# Patient Record
Sex: Male | Born: 1950 | Race: White | Hispanic: No | Marital: Married | Smoking: Former smoker
Health system: Southern US, Community
[De-identification: ages and names within clinical notes are randomized; demographics above are authoritative.]

## PROBLEM LIST (undated history)

## (undated) DIAGNOSIS — E559 Vitamin D deficiency, unspecified: Secondary | ICD-10-CM

## (undated) DIAGNOSIS — I10 Essential (primary) hypertension: Secondary | ICD-10-CM

## (undated) DIAGNOSIS — E785 Hyperlipidemia, unspecified: Secondary | ICD-10-CM

## (undated) DIAGNOSIS — R7303 Prediabetes: Secondary | ICD-10-CM

## (undated) HISTORY — DX: Essential (primary) hypertension: I10

## (undated) HISTORY — DX: Prediabetes: R73.03

## (undated) HISTORY — DX: Vitamin D deficiency, unspecified: E55.9

## (undated) HISTORY — DX: Hyperlipidemia, unspecified: E78.5

---

## 1986-11-02 HISTORY — PX: KNEE ARTHROSCOPY: SHX127

## 1998-02-12 ENCOUNTER — Ambulatory Visit (HOSPITAL_COMMUNITY): Admission: RE | Admit: 1998-02-12 | Discharge: 1998-02-12 | Payer: Self-pay | Admitting: Internal Medicine

## 1998-07-22 ENCOUNTER — Ambulatory Visit (HOSPITAL_COMMUNITY): Admission: RE | Admit: 1998-07-22 | Discharge: 1998-07-22 | Payer: Self-pay | Admitting: Gastroenterology

## 2001-03-23 ENCOUNTER — Ambulatory Visit (HOSPITAL_COMMUNITY): Admission: RE | Admit: 2001-03-23 | Discharge: 2001-03-23 | Payer: Self-pay | Admitting: Internal Medicine

## 2001-03-23 ENCOUNTER — Encounter: Payer: Self-pay | Admitting: Internal Medicine

## 2002-05-19 ENCOUNTER — Emergency Department (HOSPITAL_COMMUNITY): Admission: EM | Admit: 2002-05-19 | Discharge: 2002-05-20 | Payer: Self-pay | Admitting: Emergency Medicine

## 2003-07-02 ENCOUNTER — Encounter: Payer: Self-pay | Admitting: Internal Medicine

## 2003-07-02 ENCOUNTER — Ambulatory Visit (HOSPITAL_COMMUNITY): Admission: RE | Admit: 2003-07-02 | Discharge: 2003-07-02 | Payer: Self-pay | Admitting: Internal Medicine

## 2003-07-16 ENCOUNTER — Ambulatory Visit (HOSPITAL_COMMUNITY): Admission: RE | Admit: 2003-07-16 | Discharge: 2003-07-16 | Payer: Self-pay | Admitting: Internal Medicine

## 2003-07-16 ENCOUNTER — Encounter: Payer: Self-pay | Admitting: Internal Medicine

## 2003-07-25 ENCOUNTER — Encounter: Payer: Self-pay | Admitting: Internal Medicine

## 2003-07-25 ENCOUNTER — Ambulatory Visit (HOSPITAL_COMMUNITY): Admission: RE | Admit: 2003-07-25 | Discharge: 2003-07-25 | Payer: Self-pay | Admitting: Internal Medicine

## 2007-06-28 ENCOUNTER — Ambulatory Visit (HOSPITAL_COMMUNITY): Admission: RE | Admit: 2007-06-28 | Discharge: 2007-06-28 | Payer: Self-pay | Admitting: Internal Medicine

## 2007-11-03 HISTORY — PX: HEMORRHOID SURGERY: SHX153

## 2008-04-16 ENCOUNTER — Encounter (INDEPENDENT_AMBULATORY_CARE_PROVIDER_SITE_OTHER): Payer: Self-pay | Admitting: *Deleted

## 2008-04-16 ENCOUNTER — Ambulatory Visit (HOSPITAL_COMMUNITY): Admission: RE | Admit: 2008-04-16 | Discharge: 2008-04-16 | Payer: Self-pay | Admitting: *Deleted

## 2011-03-17 NOTE — Op Note (Signed)
NAME:  Scott Parrish, Scott Parrish                ACCOUNT NO.:  [id number]   MEDICAL RECORD NO.:  [id number]          PATIENT TYPE:  AMB   LOCATION:  DAY                          FACILITY:  [facility]   PHYSICIAN:  Alfonse Ras, MD   DATE OF BIRTH:  1951/09/04   DATE OF PROCEDURE:  04/16/2008  DATE OF DISCHARGE:                               OPERATIVE REPORT   PREOPERATIVE DIAGNOSIS:  Grade II and III internal hemorrhoids.   POSTOPERATIVE DIAGNOSIS:  Grade II and III internal hemorrhoids.   PROCEDURE:  Stapled hemorrhoidectomy, BPH.   SURGEON:  Alfonse Ras, MD.   ANESTHESIA:  General.   DESCRIPTION:  The patient was taken to the operating room and placed in  supine position.  After general anesthesia was induced using  endotracheal tube, the patient was placed in the  prone-jackknife  position.  Perianal and rectal prep were undertaken in the standard  fashion.  Anal dilatation was accomplished gently to three  fingerbreadths.  Internal hemorrhoidal bundles were injected in all  three quadrants with 0.25 Marcaine with Wydase. This was massaged in  place.  Additional 40 mL of 0.25 Marcaine were injected into the  internal sphincter muscle circumferentially.  I then placed a 2-0  Prolene suture in a pursestring fashion, approximately 5 cm proximal to  the dentate line in the mucosal and submucosal fashion.  This was done  circumferentially.  PPH stapler was then inserted proximal to the  pursestring suture and the pursestring was tied down around the anvil.  The sutures were pulled through the stapling device and the stapler was  closed and held in place for 1 minute.  It was then fired and held in  place for an additional 30 seconds.  It was removed without any  difficulty.  I had a good 2 cm rim of hemorrhoidal tissue that was sent  for pathologic evaluation.  Staple line was inspected.  Adequate  hemostasis was ensured.  Gelfoam packing was placed.  The patient  tolerated the procedure  well and went to  PACU in good condition.      Alfonse Ras, MD  Electronically Signed     KRE/MEDQ  D:  04/16/2008  T:  04/16/2008  Job:  [id number]

## 2011-07-30 LAB — BASIC METABOLIC PANEL
BUN: 14
CO2: 24
Chloride: 108
Glucose, Bld: 102 — ABNORMAL HIGH
Potassium: 3.8

## 2011-07-30 LAB — HEMOGLOBIN AND HEMATOCRIT, BLOOD: Hemoglobin: 14.1

## 2013-09-28 ENCOUNTER — Other Ambulatory Visit: Payer: Self-pay | Admitting: Internal Medicine

## 2014-01-19 DIAGNOSIS — R7303 Prediabetes: Secondary | ICD-10-CM | POA: Insufficient documentation

## 2014-01-19 DIAGNOSIS — E785 Hyperlipidemia, unspecified: Secondary | ICD-10-CM | POA: Insufficient documentation

## 2014-01-19 DIAGNOSIS — I10 Essential (primary) hypertension: Secondary | ICD-10-CM | POA: Insufficient documentation

## 2014-01-19 DIAGNOSIS — E559 Vitamin D deficiency, unspecified: Secondary | ICD-10-CM | POA: Insufficient documentation

## 2014-01-22 ENCOUNTER — Encounter: Payer: Self-pay | Admitting: Internal Medicine

## 2014-01-22 ENCOUNTER — Ambulatory Visit (INDEPENDENT_AMBULATORY_CARE_PROVIDER_SITE_OTHER): Payer: Managed Care, Other (non HMO) | Admitting: Internal Medicine

## 2014-01-22 VITALS — BP 124/86 | HR 68 | Temp 98.2°F | Resp 16 | Ht 70.0 in | Wt 221.0 lb

## 2014-01-22 DIAGNOSIS — Z1212 Encounter for screening for malignant neoplasm of rectum: Secondary | ICD-10-CM

## 2014-01-22 DIAGNOSIS — I10 Essential (primary) hypertension: Secondary | ICD-10-CM

## 2014-01-22 DIAGNOSIS — Z0000 Encounter for general adult medical examination without abnormal findings: Secondary | ICD-10-CM

## 2014-01-22 DIAGNOSIS — R740 Nonspecific elevation of levels of transaminase and lactic acid dehydrogenase [LDH]: Secondary | ICD-10-CM

## 2014-01-22 DIAGNOSIS — E559 Vitamin D deficiency, unspecified: Secondary | ICD-10-CM

## 2014-01-22 DIAGNOSIS — Z79899 Other long term (current) drug therapy: Secondary | ICD-10-CM | POA: Insufficient documentation

## 2014-01-22 DIAGNOSIS — R7402 Elevation of levels of lactic acid dehydrogenase (LDH): Secondary | ICD-10-CM

## 2014-01-22 DIAGNOSIS — Z125 Encounter for screening for malignant neoplasm of prostate: Secondary | ICD-10-CM

## 2014-01-22 DIAGNOSIS — Z113 Encounter for screening for infections with a predominantly sexual mode of transmission: Secondary | ICD-10-CM

## 2014-01-22 DIAGNOSIS — R7303 Prediabetes: Secondary | ICD-10-CM

## 2014-01-22 MED ORDER — ATENOLOL 100 MG PO TABS: 100.0000 mg | ORAL_TABLET | Freq: Every day | ORAL | Status: AC

## 2014-01-22 NOTE — Progress Notes (Signed)
Patient ID: Scott Parrish, male   DOB: [birth year], 63 y.o.   MRN: [id number]   Annual Screening Comprehensive Examination  This very nice 63 y.o.  MWM presents for complete physical.  Patient has been followed for HTN, Prediabetes, Hyperlipidemia, and Vitamin D Deficiency.   HTN predates since 301990. Patient relates he had migraines prior to that time , but never has had a HA since. Patient's BP has been controlled at home. Today's BP is  124/86 mmHg. Patient denies any cardiac symptoms as chest pain, palpitations, shortness of breath, dizziness or ankle swelling.   Patient's hyperlipidemia is controlled with diet and medications. Patient denies myalgias or other medication SE's. Last cholesterol last  was 153, triglycerides 70, HDL 40 and LDL 99.     Patient has prediabetes with last A1c 5.9% in Sept 2013. Patient denies reactive hypoglycemic symptoms, visual blurring, diabetic polys, or paresthesias.    Finally, patient has history of Vitamin D Deficiency of 20 in 2008  And last vitamin D was in 76.5 in Sept 2013.  Medication Sig  . atenolol (TENORMIN) 100 MG tablet TAKE 1 TABLET DAILY FOR BLOOD PRESSURE  . Cholecalciferol (VITAMIN D PO) Take 50,000  Units Mon, Wed,Thurs  . Tumeric caps Take 1 cap daily      Allergies  Allergen Reactions  . Reglan [Metoclopramide]     Dysphoria  . Wellbutrin [Bupropion]     Blurred vision    Past Medical History  Diagnosis Date  . Hypertension   . Vitamin D deficiency   . Hyperlipidemia   . Prediabetes     Past Surgical History  Procedure Laterality Date  . Hemorrhoid surgery  2009  . Knee arthroscopy Left 1988    Family History  Problem Relation Age of Onset  . Cancer Mother     Colon    History   Social History  . Marital Status: Married    Spouse Name: N/A    Number of Children: N/A  . Years of Education: N/A   Occupational History  . Retired 01/30/2013 after 10 yrs at [employer] and 23 years before that at [employer].   Social  History Main Topics  . Smoking status: Former Games developermoker  . Smokeless tobacco: Not on file  . Alcohol Use: Yes     Comment: Occasional  . Drug Use: No  . Sexual Activity: Not admitted    ROS Constitutional: Denies fever, chills, weight loss/gain, headaches, insomnia, fatigue, night sweats, and change in appetite. Eyes: Denies redness, blurred vision, diplopia, discharge, itchy, watery eyes.  ENT: Denies discharge, congestion, post nasal drip, epistaxis, sore throat, earache, hearing loss, dental pain, Tinnitus, Vertigo, Sinus pain, snoring.  Cardio: Denies chest pain, palpitations, irregular heartbeat, syncope, dyspnea, diaphoresis, orthopnea, PND, claudication, edema Respiratory: denies cough, dyspnea, DOE, pleurisy, hoarseness, laryngitis, wheezing.  Gastrointestinal: Denies dysphagia, heartburn, reflux, water brash, pain, cramps, nausea, vomiting, bloating, diarrhea, constipation, hematemesis, melena, hematochezia, jaundice, hemorrhoids Genitourinary: Denies dysuria, frequency, urgency, nocturia, hesitancy, discharge, hematuria, flank pain Musculoskeletal: Denies arthralgia, myalgia, stiffness, Jt. Swelling, pain, limp, and strain/sprain. Skin: Denies puritis, rash, hives, warts, acne, eczema, changing in skin lesion Neuro: No weakness, tremor, incoordination, spasms, paresthesia, pain Psychiatric: Denies confusion, memory loss, sensory loss Endocrine: Denies change in weight, skin, hair change, nocturia, and paresthesia, diabetic polys, visual blurring, hyper / hypo glycemic episodes.  Heme/Lymph: No excessive bleeding, bruising, or elarged lymph nodes.  Physical Exam  BP 124/86  Pulse 68  Temp(Src) 98.2 F (36.8 C) (Temporal)  Resp 16  Ht 5\' 10"  (1.778 m)  Wt 221 lb (100.245 kg)  BMI 31.71 kg/m2  General Appearance: Well nourished, in no apparent distress. Eyes: PERRLA, EOMs, conjunctiva no swelling or erythema, normal fundi and vessels. Sinuses: No frontal/maxillary  tenderness ENT/Mouth: EACs patent / TMs  nl. Nares clear without erythema, swelling, mucoid exudates. Oral hygiene is good. No erythema, swelling, or exudate. Tongue normal, non-obstructing. Tonsils not swollen or erythematous. Hearing normal.  Neck: Supple, thyroid normal. No bruits, nodes or JVD. Respiratory: Respiratory effort normal.  BS equal and clear bilateral without rales, rhonci, wheezing or stridor. Cardio: Heart sounds are normal with regular rate and rhythm and no murmurs, rubs or gallops. Peripheral pulses are normal and equal bilaterally without edema. No aortic or femoral bruits. Chest: symmetric with normal excursions and percussion.  Abdomen: Flat, soft, with bowl sounds. Nontender, no guarding, rebound, hernias, masses, or organomegaly.  Lymphatics: Non tender without lymphadenopathy.  Genitourinary: No hernias.Testes nl. DRE - prostate nl for age - smooth & firm w/o nodules. Musculoskeletal: Full ROM all peripheral extremities, joint stability, 5/5 strength, and normal gait. Skin: Warm and dry without rashes, lesions, cyanosis, clubbing or  ecchymosis.  Neuro: Cranial nerves intact, reflexes equal bilaterally. Normal muscle tone, no cerebellar symptoms. Sensation intact.  Pysch: Awake and oriented X 3, normal affect, insight and judgment appropriate.   Assessment and Plan  1. Annual Screening Examination 2. Hypertension  3. Hyperlipidemia 4. Pre Diabetes 5. Vitamin D Deficiency  Continue prudent diet as discussed, weight control, BP monitoring, regular exercise, and medications as discussed.  Discussed med effects and SE's. Routine screening labs and tests as requested with regular follow-up as recommended.

## 2014-01-22 NOTE — Patient Instructions (Addendum)
Sinusitis Sinusitis is redness, soreness, and swelling (inflammation) of the paranasal sinuses. Paranasal sinuses are air pockets within the bones of your face (beneath the eyes, the middle of the forehead, or above the eyes). In healthy paranasal sinuses, mucus is able to drain out, and air is able to circulate through them by way of your nose. However, when your paranasal sinuses are inflamed, mucus and air can become trapped. This can allow bacteria and other germs to grow and cause infection. Sinusitis can develop quickly and last only a short time (acute) or continue over a long period (chronic). Sinusitis that lasts for more than 12 weeks is considered chronic.  CAUSES  Causes of sinusitis include:  Allergies.  Structural abnormalities, such as displacement of the cartilage that separates your nostrils (deviated septum), which can decrease the air flow through your nose and sinuses and affect sinus drainage.  Functional abnormalities, such as when the small hairs (cilia) that line your sinuses and help remove mucus do not work properly or are not present. SYMPTOMS  Symptoms of acute and chronic sinusitis are the same. The primary symptoms are pain and pressure around the affected sinuses. Other symptoms include:  Upper toothache.  Earache.  Headache.  Bad breath.  Decreased sense of smell and taste.  A cough, which worsens when you are lying flat.  Fatigue.  Fever.  Thick drainage from your nose, which often is green and may contain pus (purulent).  Swelling and warmth over the affected sinuses. DIAGNOSIS  Your caregiver will perform a physical exam. During the exam, your caregiver may:  Look in your nose for signs of abnormal growths in your nostrils (nasal polyps).  Tap over the affected sinus to check for signs of infection.  View the inside of your sinuses (endoscopy) with a special imaging device with a light attached (endoscope), which is inserted into your  sinuses. If your caregiver suspects that you have chronic sinusitis, one or more of the following tests may be recommended:  Allergy tests.  Nasal culture A sample of mucus is taken from your nose and sent to a lab and screened for bacteria.  Nasal cytology A sample of mucus is taken from your nose and examined by your caregiver to determine if your sinusitis is related to an allergy. TREATMENT  Most cases of acute sinusitis are related to a viral infection and will resolve on their own within 10 days. Sometimes medicines are prescribed to help relieve symptoms (pain medicine, decongestants, nasal steroid sprays, or saline sprays).  However, for sinusitis related to a bacterial infection, your caregiver will prescribe antibiotic medicines. These are medicines that will help kill the bacteria causing the infection.  Rarely, sinusitis is caused by a fungal infection. In theses cases, your caregiver will prescribe antifungal medicine. For some cases of chronic sinusitis, surgery is needed. Generally, these are cases in which sinusitis recurs more than 3 times per year, despite other treatments. HOME CARE INSTRUCTIONS   Drink plenty of water. Water helps thin the mucus so your sinuses can drain more easily.  Use a humidifier.  Inhale steam 3 to 4 times a day (for example, sit in the bathroom with the shower running).  Apply a warm, moist washcloth to your face 3 to 4 times a day, or as directed by your caregiver.  Use saline nasal sprays to help moisten and clean your sinuses.  Take over-the-counter or prescription medicines for pain, discomfort, or fever only as directed by your caregiver. SEEK IMMEDIATE  MEDICAL CARE IF:  You have increasing pain or severe headaches.  You have nausea, vomiting, or drowsiness.  You have swelling around your face.  You have vision problems.  You have a stiff neck.  You have difficulty breathing. MAKE SURE YOU:   Understand these  instructions.  Will watch your condition.  Will get help right away if you are not doing well or get worse. Document Released: 10/19/2005 Document Revised: 01/11/2012 Document Reviewed: 11/03/2011 [facility] Patient Information 2014 [employer], [place].   Hypertension As your heart beats, it forces blood through your arteries. This force is your blood pressure. If the pressure is too high, it is called hypertension (HTN) or high blood pressure. HTN is dangerous because you may have it and not know it. High blood pressure may mean that your heart has to work harder to pump blood. Your arteries may be narrow or stiff. The extra work puts you at risk for heart disease, stroke, and other problems.  Blood pressure consists of two numbers, a higher number over a lower, 110/72, for example. It is stated as "110 over 72." The ideal is below 120 for the top number (systolic) and under 80 for the bottom (diastolic). Write down your blood pressure today. You should pay close attention to your blood pressure if you have certain conditions such as:  Heart failure.  Prior heart attack.  Diabetes  Chronic kidney disease.  Prior stroke.  Multiple risk factors for heart disease. To see if you have HTN, your blood pressure should be measured while you are seated with your arm held at the level of the heart. It should be measured at least twice. A one-time elevated blood pressure reading (especially in the Emergency Department) does not mean that you need treatment. There may be conditions in which the blood pressure is different between your right and left arms. It is important to see your caregiver soon for a recheck. Most people have essential hypertension which means that there is not a specific cause. This type of high blood pressure may be lowered by changing lifestyle factors such as:  Stress.  Smoking.  Lack of exercise.  Excessive weight.  Drug/tobacco/alcohol use.  Eating less salt. Most  people do not have symptoms from high blood pressure until it has caused damage to the body. Effective treatment can often prevent, delay or reduce that damage. TREATMENT  When a cause has been identified, treatment for high blood pressure is directed at the cause. There are a large number of medications to treat HTN. These fall into several categories, and your caregiver will help you select the medicines that are best for you. Medications may have side effects. You should review side effects with your caregiver. If your blood pressure stays high after you have made lifestyle changes or started on medicines,   Your medication(s) may need to be changed.  Other problems may need to be addressed.  Be certain you understand your prescriptions, and know how and when to take your medicine.  Be sure to follow up with your caregiver within the time frame advised (usually within two weeks) to have your blood pressure rechecked and to review your medications.  If you are taking more than one medicine to lower your blood pressure, make sure you know how and at what times they should be taken. Taking two medicines at the same time can result in blood pressure that is too low. SEEK IMMEDIATE MEDICAL CARE IF:  You develop a severe headache, blurred  changing vision, or confusion.  You have unusual weakness or numbness, or a faint feeling.  You have severe chest or abdominal pain, vomiting, or breathing problems. MAKE SURE YOU:   Understand these instructions.  Will watch your condition.  Will get help right away if you are not doing well or get worse.   Diabetes and Exercise Exercising regularly is important. It is not just about losing weight. It has many health benefits, such as:  Improving your overall fitness, flexibility, and endurance.  Increasing your bone density.  Helping with weight control.  Decreasing your body fat.  Increasing your muscle strength.  Reducing stress and  tension.  Improving your overall health. People with diabetes who exercise gain additional benefits because exercise:  Reduces appetite.  Improves the body's use of blood sugar (glucose).  Helps lower or control blood glucose.  Decreases blood pressure.  Helps control blood lipids (such as cholesterol and triglycerides).  Improves the body's use of the hormone insulin by:  Increasing the body's insulin sensitivity.  Reducing the body's insulin needs.  Decreases the risk for heart disease because exercising:  Lowers cholesterol and triglycerides levels.  Increases the levels of good cholesterol (such as high-density lipoproteins [HDL]) in the body.  Lowers blood glucose levels. YOUR ACTIVITY PLAN  Choose an activity that you enjoy and set realistic goals. Your health care provider or diabetes educator can help you make an activity plan that works for you. You can break activities into 2 or 3 sessions throughout the day. Doing so is as good as one long session. Exercise ideas include:  Taking the dog for a walk.  Taking the stairs instead of the elevator.  Dancing to your favorite song.  Doing your favorite exercise with a friend. RECOMMENDATIONS FOR EXERCISING WITH TYPE 1 OR TYPE 2 DIABETES   Check your blood glucose before exercising. If blood glucose levels are greater than 240 mg/dL, check for urine ketones. Do not exercise if ketones are present.  Avoid injecting insulin into areas of the body that are going to be exercised. For example, avoid injecting insulin into:  The arms when playing tennis.  The legs when jogging.  Keep a record of:  Food intake before and after you exercise.  Expected peak times of insulin action.  Blood glucose levels before and after you exercise.  The type and amount of exercise you have done.  Review your records with your health care provider. Your health care provider will help you to develop guidelines for adjusting food  intake and insulin amounts before and after exercising.  If you take insulin or oral hypoglycemic agents, watch for signs and symptoms of hypoglycemia. They include:  Dizziness.  Shaking.  Sweating.  Chills.  Confusion.  Drink plenty of water while you exercise to prevent dehydration or heat stroke. Body water is lost during exercise and must be replaced.  Talk to your health care provider before starting an exercise program to make sure it is safe for you. Remember, almost any type of activity is better than none.    Cholesterol Cholesterol is a white, waxy, fat-like protein needed by your body in small amounts. The liver makes all the cholesterol you need. It is carried from the liver by the blood through the blood vessels. Deposits (plaque) may build up on blood vessel walls. This makes the arteries narrower and stiffer. Plaque increases the risk for heart attack and stroke. You cannot feel your cholesterol level even if it is very high.   The only way to know is by a blood test to check your lipid (fats) levels. Once you know your cholesterol levels, you should keep a record of the test results. Work with your caregiver to to keep your levels in the desired range. WHAT THE RESULTS MEAN:  Total cholesterol is a rough measure of all the cholesterol in your blood.  LDL is the so-called bad cholesterol. This is the type that deposits cholesterol in the walls of the arteries. You want this level to be low.  HDL is the good cholesterol because it cleans the arteries and carries the LDL away. You want this level to be high.  Triglycerides are fat that the body can either burn for energy or store. High levels are closely linked to heart disease. DESIRED LEVELS:  Total cholesterol below 200.  LDL below 100 for people at risk, below 70 for very high risk.  HDL above 50 is good, above 60 is best.  Triglycerides below 150. HOW TO LOWER YOUR CHOLESTEROL:  Diet.  Choose fish or white  meat chicken and turkey, roasted or baked. Limit fatty cuts of red meat, fried foods, and processed meats, such as sausage and lunch meat.  Eat lots of fresh fruits and vegetables. Choose whole grains, beans, pasta, potatoes and cereals.  Use only small amounts of olive, corn or canola oils. Avoid butter, mayonnaise, shortening or palm kernel oils. Avoid foods with trans-fats.  Use skim/nonfat milk and low-fat/nonfat yogurt and cheeses. Avoid whole milk, cream, ice cream, egg yolks and cheeses. Healthy desserts include angel food cake, ginger snaps, animal crackers, hard candy, popsicles, and low-fat/nonfat frozen yogurt. Avoid pastries, cakes, pies and cookies.  Exercise.  A regular program helps decrease LDL and raises HDL.  Helps with weight control.  Do things that increase your activity level like gardening, walking, or taking the stairs.  Medication.  May be prescribed by your caregiver to help lowering cholesterol and the risk for heart disease.  You may need medicine even if your levels are normal if you have several risk factors. HOME CARE INSTRUCTIONS   Follow your diet and exercise programs as suggested by your caregiver.  Take medications as directed.  Have blood work done when your caregiver feels it is necessary. MAKE SURE YOU:   Understand these instructions.  Will watch your condition.  Will get help right away if you are not doing well or get worse.      Vitamin D Deficiency Vitamin D is an important vitamin that your body needs. Having too little of it in your body is called a deficiency. A very bad deficiency can make your bones soft and can cause a condition called rickets.  Vitamin D is important to your body for different reasons, such as:   It helps your body absorb 2 minerals called calcium and phosphorus.  It helps make your bones healthy.  It may prevent some diseases, such as diabetes and multiple sclerosis.  It helps your muscles and  heart. You can get vitamin D in several ways. It is a natural part of some foods. The vitamin is also added to some dairy products and cereals. Some people take vitamin D supplements. Also, your body makes vitamin D when you are in the sun. It changes the sun's rays into a form of the vitamin that your body can use. CAUSES   Not eating enough foods that contain vitamin D.  Not getting enough sunlight.  Having certain digestive system diseases that make   make it hard to absorb vitamin D. These diseases include Crohn's disease, chronic pancreatitis, and cystic fibrosis.  Having a surgery in which part of the stomach or small intestine is removed.  Being obese. Fat cells pull vitamin D out of your blood. That means that obese people may not have enough vitamin D left in their blood and in other body tissues.  Having chronic kidney or liver disease. RISK FACTORS Risk factors are things that make you more likely to develop a vitamin D deficiency. They include:  Being older.  Not being able to get outside very much.  Living in a nursing home.  Having had broken bones.  Having weak or thin bones (osteoporosis).  Having a disease or condition that changes how your body absorbs vitamin D.  Having dark skin.  Some medicines such as seizure medicines or steroids.  Being overweight or obese. SYMPTOMS Mild cases of vitamin D deficiency may not have any symptoms. If you have a very bad case, symptoms may include:  Bone pain.  Muscle pain.  Falling often.  Broken bones caused by a minor injury, due to osteoporosis. DIAGNOSIS A blood test is the best way to tell if you have a vitamin D deficiency. TREATMENT Vitamin D deficiency can be treated in different ways. Treatment for vitamin D deficiency depends on what is causing it. Options include:  Taking vitamin D supplements.  Taking a calcium supplement. Your caregiver will suggest what dose is best for you. HOME CARE  INSTRUCTIONS  Take any supplements that your caregiver prescribes. Follow the directions carefully. Take only the suggested amount.  Have your blood tested 2 months after you start taking supplements.  Eat foods that contain vitamin D. Healthy choices include:  Fortified dairy products, cereals, or juices. Fortified means vitamin D has been added to the food. Check the label on the package to be sure.  Fatty fish like salmon or trout.  Eggs.  Oysters.  Do not use a tanning bed.  Keep your weight at a healthy level. Lose weight if you need to.  Keep all follow-up appointments. Your caregiver will need to perform blood tests to make sure your vitamin D deficiency is going away. SEEK MEDICAL CARE IF:  You have any questions about your treatment.  You continue to have symptoms of vitamin D deficiency.  You have nausea or vomiting.  You are constipated.  You feel confused.  You have severe abdominal or back pain. MAKE SURE YOU:  Understand these instructions.  Will watch your condition.  Will get help right away if you are not doing well or get worse.

## 2014-07-26 ENCOUNTER — Ambulatory Visit (INDEPENDENT_AMBULATORY_CARE_PROVIDER_SITE_OTHER): Payer: Self-pay | Admitting: Internal Medicine

## 2014-07-26 DIAGNOSIS — R69 Illness, unspecified: Secondary | ICD-10-CM

## 2014-07-26 NOTE — Progress Notes (Signed)
[name], male   DOB: Oct 04, 1951, 63 y.o.   MRN: [id number]   [name]

## 2014-08-15 ENCOUNTER — Telehealth: Payer: Self-pay | Admitting: Internal Medicine

## 2014-08-15 ENCOUNTER — Encounter: Payer: Self-pay | Admitting: Internal Medicine

## 2014-10-23 NOTE — Telephone Encounter (Signed)
ERROR

## 2015-01-23 ENCOUNTER — Encounter: Payer: Self-pay | Admitting: Internal Medicine

## 2015-07-03 ENCOUNTER — Other Ambulatory Visit: Payer: Self-pay | Admitting: Dermatology

## 2018-01-05 ENCOUNTER — Other Ambulatory Visit: Payer: Self-pay | Admitting: Family Medicine

## 2018-01-05 DIAGNOSIS — H53131 Sudden visual loss, right eye: Secondary | ICD-10-CM

## 2018-01-06 ENCOUNTER — Other Ambulatory Visit: Payer: Self-pay | Admitting: Family Medicine

## 2018-01-06 DIAGNOSIS — H53131 Sudden visual loss, right eye: Secondary | ICD-10-CM

## 2018-01-17 ENCOUNTER — Ambulatory Visit
Admission: RE | Admit: 2018-01-17 | Discharge: 2018-01-17 | Disposition: A | Discharge status: Home / Self Care | Payer: Medicare Other | Source: Ambulatory Visit | Attending: Family Medicine | Admitting: Family Medicine

## 2018-01-17 DIAGNOSIS — H53131 Sudden visual loss, right eye: Secondary | ICD-10-CM | POA: Insufficient documentation

## 2018-01-17 DIAGNOSIS — I6789 Other cerebrovascular disease: Secondary | ICD-10-CM | POA: Insufficient documentation

## 2018-01-17 MED ORDER — GADOBENATE DIMEGLUMINE 529 MG/ML IV SOLN
20.0000 mL | Freq: Once | INTRAVENOUS | Status: AC | PRN
  Administered 2018-01-17: 20 mL via INTRAVENOUS

## 2021-05-21 ENCOUNTER — Other Ambulatory Visit: Payer: Self-pay | Admitting: Family Medicine

## 2021-05-21 DIAGNOSIS — F17290 Nicotine dependence, other tobacco product, uncomplicated: Secondary | ICD-10-CM

## 2021-05-21 DIAGNOSIS — Z136 Encounter for screening for cardiovascular disorders: Secondary | ICD-10-CM

## 2021-06-16 ENCOUNTER — Ambulatory Visit
Admission: RE | Admit: 2021-06-16 | Discharge: 2021-06-16 | Disposition: A | Discharge status: Home / Self Care | Payer: Medicare Other | Source: Ambulatory Visit | Attending: Family Medicine | Admitting: Family Medicine

## 2021-06-16 ENCOUNTER — Other Ambulatory Visit: Payer: Self-pay

## 2021-06-16 DIAGNOSIS — Z136 Encounter for screening for cardiovascular disorders: Secondary | ICD-10-CM | POA: Insufficient documentation

## 2021-06-16 DIAGNOSIS — F17290 Nicotine dependence, other tobacco product, uncomplicated: Secondary | ICD-10-CM | POA: Diagnosis present

## 2021-12-22 ENCOUNTER — Other Ambulatory Visit: Payer: Self-pay | Admitting: Otolaryngology

## 2021-12-22 DIAGNOSIS — H903 Sensorineural hearing loss, bilateral: Secondary | ICD-10-CM

## 2021-12-22 DIAGNOSIS — R42 Dizziness and giddiness: Secondary | ICD-10-CM

## 2021-12-24 ENCOUNTER — Ambulatory Visit
Admission: RE | Admit: 2021-12-24 | Discharge: 2021-12-24 | Disposition: A | Discharge status: Home / Self Care | Payer: Medicare Other | Source: Ambulatory Visit | Attending: Otolaryngology | Admitting: Otolaryngology

## 2021-12-24 DIAGNOSIS — R42 Dizziness and giddiness: Secondary | ICD-10-CM

## 2022-01-10 ENCOUNTER — Other Ambulatory Visit: Payer: Self-pay

## 2022-01-10 ENCOUNTER — Ambulatory Visit
Admission: RE | Admit: 2022-01-10 | Discharge: 2022-01-10 | Disposition: A | Discharge status: Home / Self Care | Payer: Medicare Other | Source: Ambulatory Visit | Attending: Otolaryngology | Admitting: Otolaryngology

## 2022-01-10 DIAGNOSIS — H903 Sensorineural hearing loss, bilateral: Secondary | ICD-10-CM

## 2022-01-10 MED ORDER — GADOBENATE DIMEGLUMINE 529 MG/ML IV SOLN
20.0000 mL | Freq: Once | INTRAVENOUS | Status: AC | PRN
  Administered 2022-01-10: 20 mL via INTRAVENOUS

## 2022-05-12 IMAGING — US US CAROTID DUPLEX BILAT
1 series · 13 of 24 positions shown · non-contrast
Comparison: None.

CLINICAL DATA: 70-year-old male with history of dizziness.

EXAM:
BILATERAL CAROTID DUPLEX ULTRASOUND
TECHNIQUE: Gray scale imaging, color Doppler and duplex ultrasound were
performed of bilateral carotid and vertebral arteries in the neck.

[Series 1: us carotid duplex bilat · 0.05mm/px · 13 of 59 slices shown]
[im 1/59]
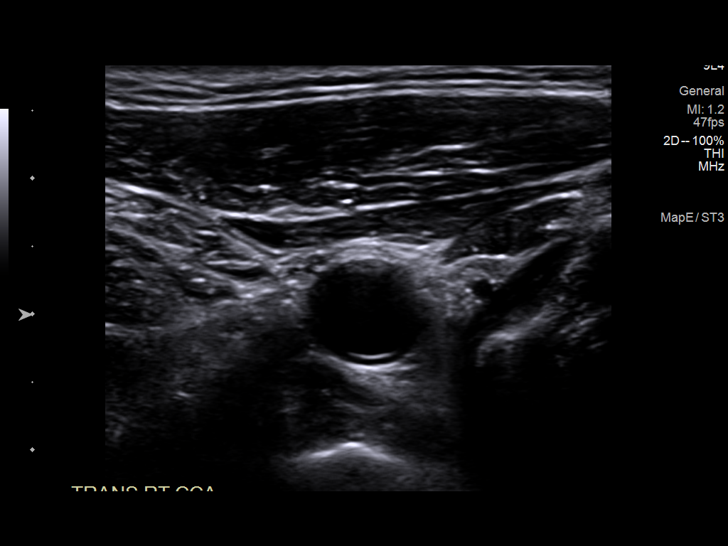
[im 6/59]
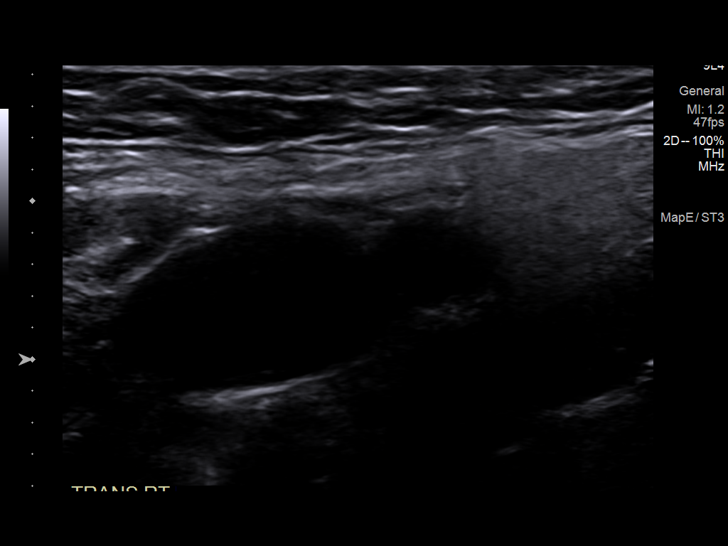
[im 11/59]
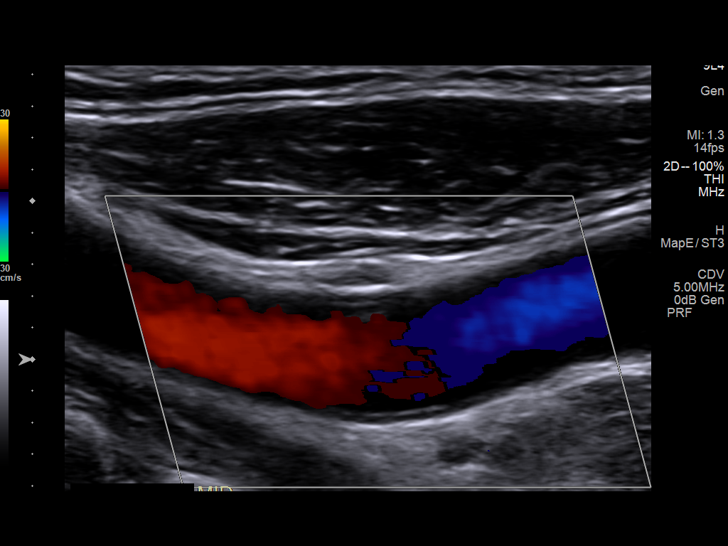
[im 16/59]
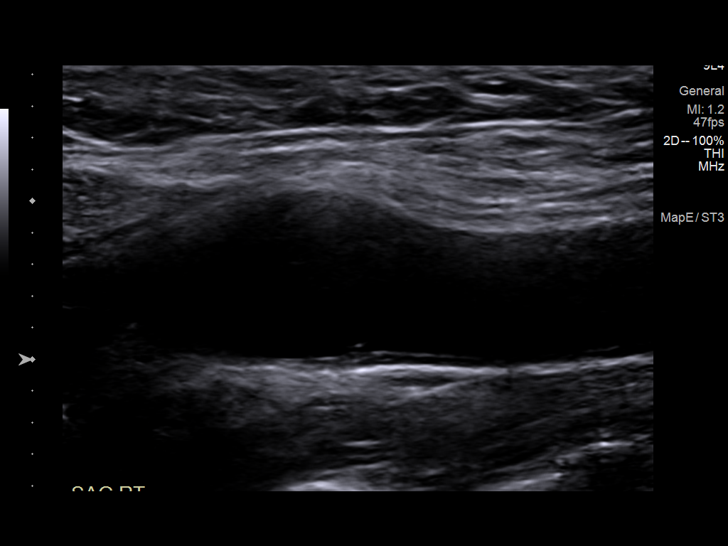
[im 21/59]
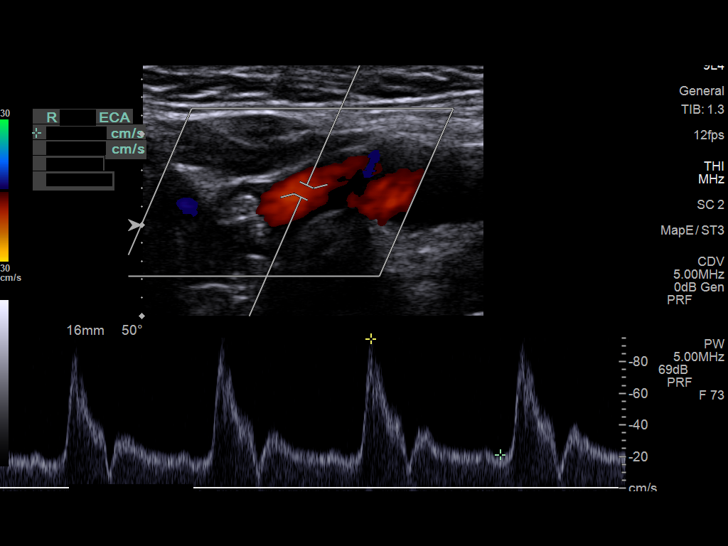
[im 26/59]
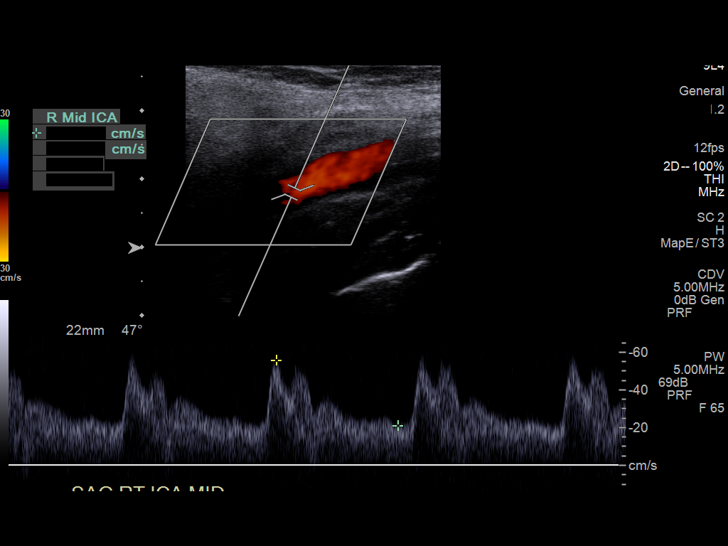
[im 31/59]
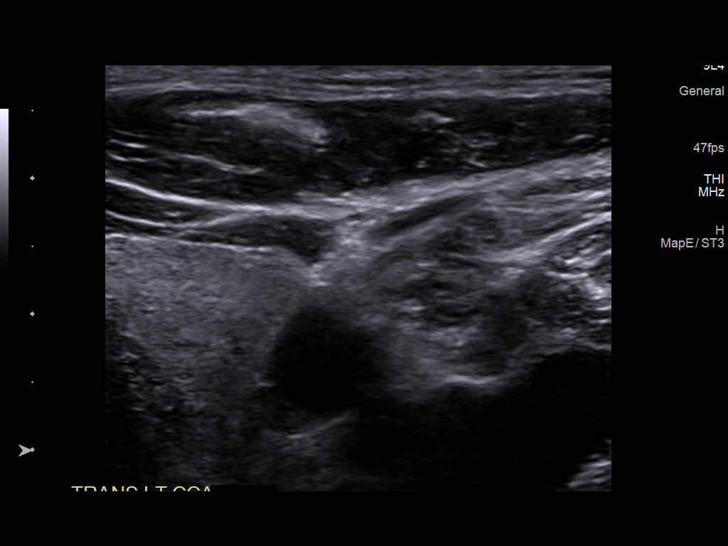
[im 33/59]
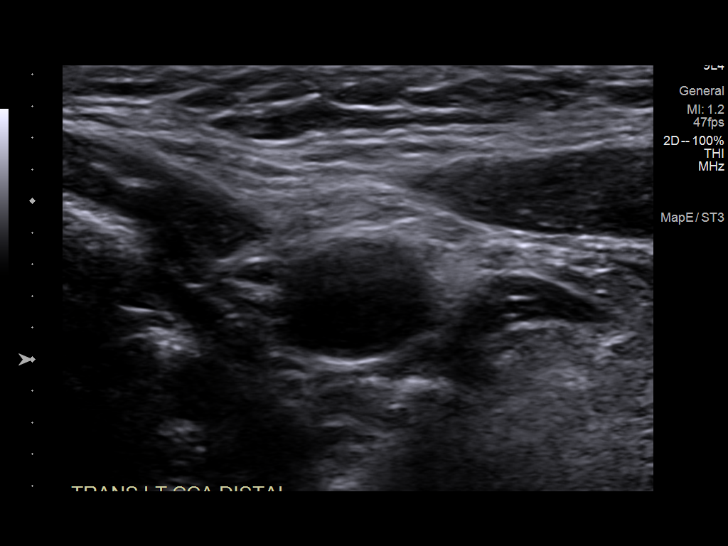
[im 38/59]
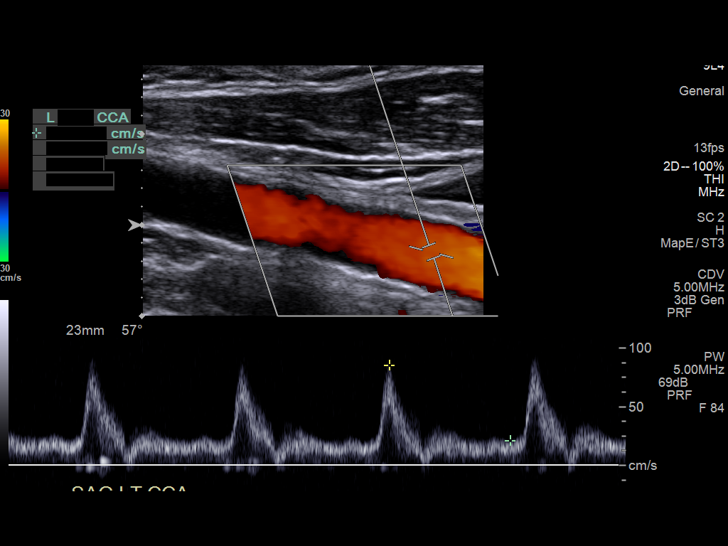
[im 43/59]
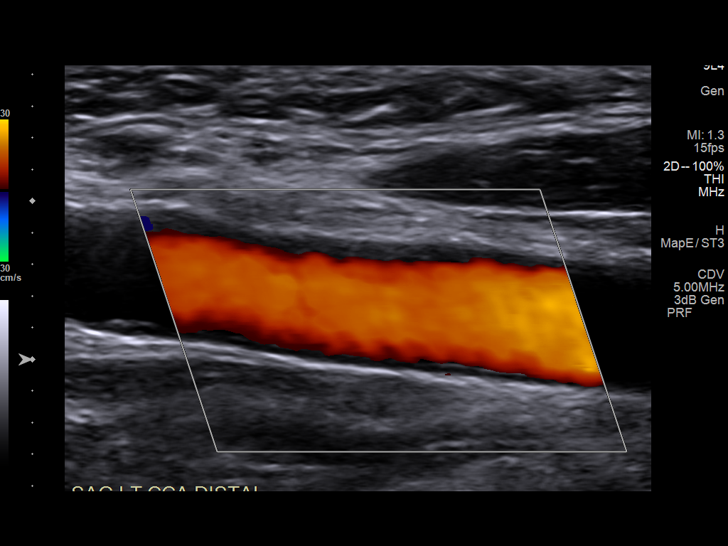
[im 48/59]
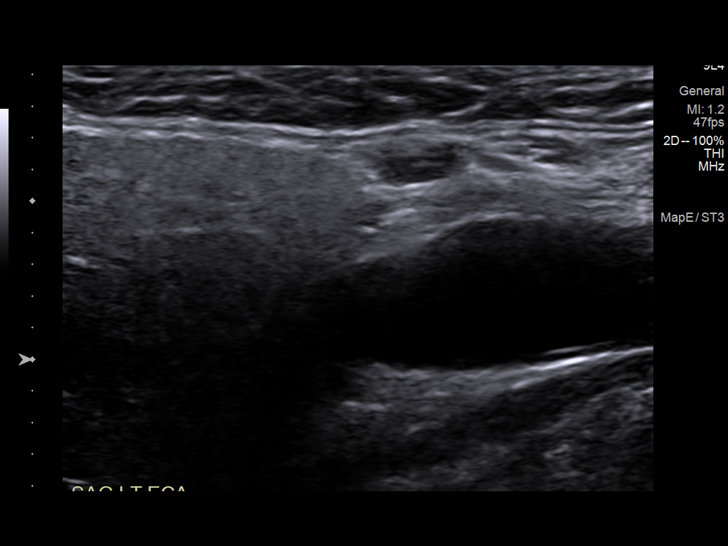
[im 53/59]
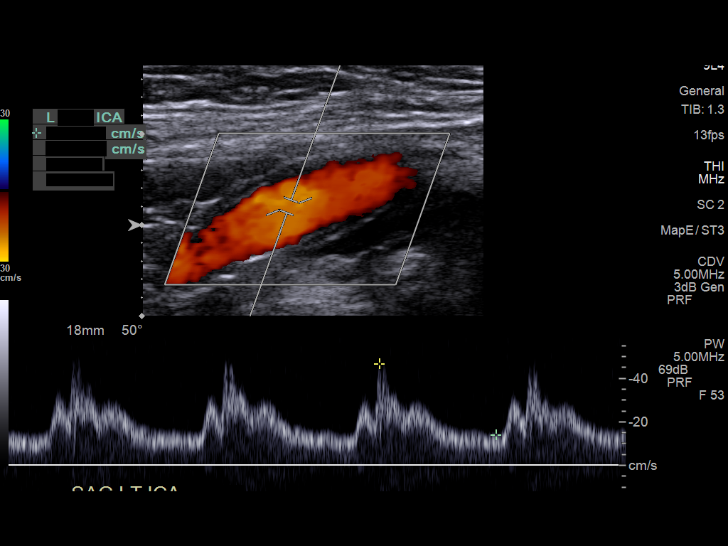
[im 59/59]
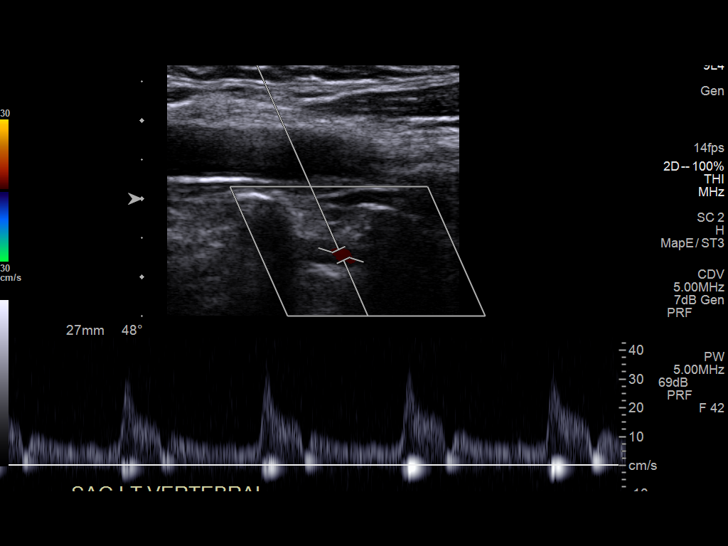

[13 of 24 positions shown; findings below may reference images not displayed]

FINDINGS: Criteria: Quantification of carotid stenosis is based on velocity
parameters that correlate the residual internal carotid diameter
with NASCET-based stenosis levels, using the diameter of the distal
internal carotid lumen as the denominator for stenosis measurement.

The following velocity measurements were obtained:

RIGHT

ICA: Peak systolic velocity 61 cm/sec, End diastolic velocity 24
cm/sec

CCA: Peak systolic velocity 146 cm/sec

SYSTOLIC ICA/CCA RATIO:

ECA: Peak systolic velocity 95 cm/sec

LEFT

ICA: Peak systolic velocity 68 cm/sec, End diastolic velocity 21
cm/sec

CCA: 92 cm/sec

SYSTOLIC ICA/CCA RATIO:

ECA: 106 cm/sec

RIGHT CAROTID ARTERY: No atherosclerotic plaque formation.
Tortuosity is noted without significant flow velocity elevation.
Normal low resistance waveforms.

RIGHT VERTEBRAL ARTERY:  Antegrade flow.

LEFT CAROTID ARTERY: No atherosclerotic plaque formation. Tortuosity
is noted without significant flow velocity elevation. Normal low
resistance waveforms.

LEFT VERTEBRAL ARTERY:  Antegrade flow.

Upper extremity non-invasive blood pressures:

Right: 139/87 mm/Hg

Left: 135/83 mm/Hg
IMPRESSION: 1. Right carotid artery system: Patent without significant
atherosclerotic plaque formation.

2. Left carotid artery system: Patent without significant
atherosclerotic plaque formation.

3.  Vertebral artery system: Patent with antegrade flow bilaterally.
